# Patient Record
Sex: Male | Born: 1960 | Hispanic: No | Marital: Single | Smoking: Never smoker
Health system: Southern US, Community
[De-identification: ages and names within clinical notes are randomized; demographics above are authoritative.]

## PROBLEM LIST (undated history)

## (undated) DIAGNOSIS — M549 Dorsalgia, unspecified: Secondary | ICD-10-CM

## (undated) HISTORY — PX: HERNIA REPAIR: SHX51

---

## 2003-11-17 ENCOUNTER — Encounter: Admission: RE | Admit: 2003-11-17 | Discharge: 2003-11-17 | Payer: Self-pay | Admitting: General Practice

## 2005-11-08 ENCOUNTER — Emergency Department (HOSPITAL_COMMUNITY): Admission: EM | Admit: 2005-11-08 | Discharge: 2005-11-08 | Payer: Self-pay | Admitting: Emergency Medicine

## 2012-09-07 ENCOUNTER — Encounter (HOSPITAL_COMMUNITY): Payer: Self-pay | Admitting: Emergency Medicine

## 2012-09-07 DIAGNOSIS — R350 Frequency of micturition: Secondary | ICD-10-CM | POA: Insufficient documentation

## 2012-09-07 DIAGNOSIS — M545 Low back pain, unspecified: Secondary | ICD-10-CM | POA: Insufficient documentation

## 2012-09-07 NOTE — ED Notes (Signed)
PT. REPORTS LOW BACK PAIN ONSET THIS EVENING , DENIES INJURY OR FALL ,NO DYSURIA OR HEMATURIA , PAIN WORSE WHEN SITTING.

## 2012-09-08 ENCOUNTER — Emergency Department (HOSPITAL_COMMUNITY)
Admission: EM | Admit: 2012-09-08 | Discharge: 2012-09-08 | Disposition: A | Discharge status: Home / Self Care | Payer: Self-pay | Attending: Emergency Medicine | Admitting: Emergency Medicine

## 2012-09-08 ENCOUNTER — Emergency Department (HOSPITAL_COMMUNITY): Payer: Self-pay

## 2012-09-08 DIAGNOSIS — M545 Low back pain: Secondary | ICD-10-CM

## 2012-09-08 LAB — URINALYSIS, ROUTINE W REFLEX MICROSCOPIC
Bilirubin Urine: NEGATIVE
Nitrite: NEGATIVE
Specific Gravity, Urine: 1.014 (ref 1.005–1.030)
Urobilinogen, UA: 0.2 mg/dL (ref 0.0–1.0)

## 2012-09-08 MED ORDER — HYDROCODONE-ACETAMINOPHEN 5-500 MG PO TABS: 1.0000 | ORAL_TABLET | Freq: Four times a day (QID) | ORAL | Status: DC | PRN

## 2012-09-08 MED ORDER — HYDROCODONE-ACETAMINOPHEN 5-325 MG PO TABS
1.0000 | ORAL_TABLET | Freq: Once | ORAL | Status: AC
  Administered 2012-09-08: 1 via ORAL
  Filled 2012-09-08: qty 1

## 2012-09-08 MED ORDER — IBUPROFEN 600 MG PO TABS: 600.0000 mg | ORAL_TABLET | Freq: Three times a day (TID) | ORAL | Status: DC | PRN

## 2012-09-08 MED ORDER — IBUPROFEN 600 MG PO TABS: 600.0000 mg | ORAL_TABLET | Freq: Four times a day (QID) | ORAL | Status: DC | PRN

## 2012-09-08 MED ORDER — IBUPROFEN 200 MG PO TABS
600.0000 mg | ORAL_TABLET | Freq: Once | ORAL | Status: AC
  Administered 2012-09-08: 600 mg via ORAL
  Filled 2012-09-08: qty 3

## 2012-09-08 NOTE — ED Notes (Signed)
Family at bedside. 

## 2012-09-08 NOTE — ED Notes (Signed)
MD at bedside. 

## 2012-09-08 NOTE — ED Provider Notes (Signed)
History     CSN: [id number]  Arrival date & time 09/07/12  2342   First MD Initiated Contact with Patient 09/08/12 0025      Chief Complaint  Patient presents with  . Back Pain    (Consider location/radiation/quality/duration/timing/severity/associated sxs/prior treatment) Patient is a 51 y.o. male presenting with back pain. The history is provided by the patient.  Back Pain  Pertinent negatives include no chest pain, no fever, no headaches, no abdominal pain and no dysuria.  pt c/o low back pain for past few days. Constant. Dull. Non radiating. Worse w turning and bending. No radicular or leg pain. No abd pain. No fever or chills. No hx ddd or chronic back pain. Denies injury or strain.  Notes urine frequency at night. No hx diabetes, no hx uti or prostatitis. No dysuria. No scrotal or testicular pain. No fever or chills.     History reviewed. No pertinent past medical history.  History reviewed. No pertinent past surgical history.  No family history on file.  History  Substance Use Topics  . Smoking status: Never Smoker   . Smokeless tobacco: Not on file  . Alcohol Use: Yes      Review of Systems  Constitutional: Negative for fever.  HENT: Negative for neck pain.   Eyes: Negative for redness.  Respiratory: Negative for shortness of breath.   Cardiovascular: Negative for chest pain.  Gastrointestinal: Negative for abdominal pain.  Genitourinary: Negative for dysuria and flank pain.  Musculoskeletal: Positive for back pain.  Skin: Negative for rash.  Neurological: Negative for headaches.  Hematological: Does not bruise/bleed easily.  Psychiatric/Behavioral: Negative for confusion.    Allergies  Review of patient's allergies indicates no known allergies.  Home Medications  No current outpatient prescriptions on file.  BP 120/80  Pulse 78  Temp 98.1 F (36.7 C) (Oral)  Resp 14  SpO2 100%  Physical Exam  Nursing note and vitals  reviewed. Constitutional: He is oriented to person, place, and time. He appears well-developed and well-nourished. No distress.  HENT:  Head: Atraumatic.  Eyes: Conjunctivae normal are normal.  Neck: Neck supple. No tracheal deviation present.  Cardiovascular: Normal rate.   Pulmonary/Chest: Effort normal and breath sounds normal. No accessory muscle usage. No respiratory distress.  Abdominal: Soft. Bowel sounds are normal. He exhibits no distension and no mass. There is no tenderness. There is no rebound and no guarding.  Genitourinary:       No cva tenderness  Musculoskeletal: Normal range of motion. He exhibits no edema and no tenderness.       Lumbar tenderness diffusely, otherwise CTLS spine, non tender, aligned, no step off.   Neurological: He is alert and oriented to person, place, and time.       Straight leg raise neg. Steady gait. Motor intact bil.   Skin: Skin is warm and dry. No rash noted.  Psychiatric: He has a normal mood and affect.    ED Course  Procedures (including critical care time)   Labs Reviewed  URINALYSIS, ROUTINE W REFLEX MICROSCOPIC    Results for orders placed during the hospital encounter of 09/08/12  URINALYSIS, ROUTINE W REFLEX MICROSCOPIC      Component Value Range   Color, Urine YELLOW  YELLOW   APPearance CLEAR  CLEAR   Specific Gravity, Urine 1.014  1.005 - 1.030   pH 8.0  5.0 - 8.0   Glucose, UA NEGATIVE  NEGATIVE mg/dL   Hgb urine dipstick NEGATIVE  NEGATIVE  Bilirubin Urine NEGATIVE  NEGATIVE   Ketones, ur NEGATIVE  NEGATIVE mg/dL   Protein, ur NEGATIVE  NEGATIVE mg/dL   Urobilinogen, UA 0.2  0.0 - 1.0 mg/dL   Nitrite NEGATIVE  NEGATIVE   Leukocytes, UA NEGATIVE  NEGATIVE   Dg Lumbar Spine Complete  09/08/2012  *RADIOLOGY REPORT*  Clinical Data: Low back pain  LUMBAR SPINE - COMPLETE 4+ VIEW  Comparison: None.  Findings: There is mild straightening of the normal thoracolumbar lordosis.  Minimal T11 vertebral body height loss and  T11-12 degenerative change.  No displaced fracture.  No dislocation.  No aggressive osseous lesions.  Overlying soft tissues unremarkable.  IMPRESSION: Mild straightening of the normal thoracolumbar lordosis, with mild T11-12 degenerative changes.  No acute osseous abnormality of the lumbar spine.   Original Report Authenticated By: Jearld Lesch, M.D.       MDM  Pt has ride, does not have to drive. vicodin po, motrin po.   Xr.   ua neg. xr w mild deg changes. Pt appears stable for d/c.         Suzi Roots, MD 09/08/12 [phone]

## 2017-07-15 ENCOUNTER — Emergency Department (HOSPITAL_COMMUNITY)
Admission: EM | Admit: 2017-07-15 | Discharge: 2017-07-15 | Disposition: A | Discharge status: Home / Self Care | Payer: Self-pay | Attending: Emergency Medicine | Admitting: Emergency Medicine

## 2017-07-15 ENCOUNTER — Encounter (HOSPITAL_COMMUNITY): Payer: Self-pay | Admitting: Emergency Medicine

## 2017-07-15 DIAGNOSIS — K4090 Unilateral inguinal hernia, without obstruction or gangrene, not specified as recurrent: Secondary | ICD-10-CM | POA: Insufficient documentation

## 2017-07-15 DIAGNOSIS — R1031 Right lower quadrant pain: Secondary | ICD-10-CM

## 2017-07-15 LAB — CBC WITH DIFFERENTIAL/PLATELET
Basophils Absolute: 0 10*3/uL (ref 0.0–0.1)
Basophils Relative: 0 %
EOS PCT: 3 %
Eosinophils Absolute: 0.1 10*3/uL (ref 0.0–0.7)
HEMATOCRIT: 43.1 % (ref 39.0–52.0)
Hemoglobin: 13.9 g/dL (ref 13.0–17.0)
LYMPHS ABS: 2 10*3/uL (ref 0.7–4.0)
LYMPHS PCT: 53 %
MCH: 28.6 pg (ref 26.0–34.0)
MCHC: 32.3 g/dL (ref 30.0–36.0)
MCV: 88.7 fL (ref 78.0–100.0)
MONO ABS: 0.2 10*3/uL (ref 0.1–1.0)
MONOS PCT: 6 %
NEUTROS PCT: 38 %
Neutro Abs: 1.4 10*3/uL — ABNORMAL LOW (ref 1.7–7.7)
Platelets: 186 10*3/uL (ref 150–400)
RBC: 4.86 MIL/uL (ref 4.22–5.81)
RDW: 14.2 % (ref 11.5–15.5)
WBC: 3.8 10*3/uL — AB (ref 4.0–10.5)

## 2017-07-15 LAB — COMPREHENSIVE METABOLIC PANEL
ALT: 68 U/L — ABNORMAL HIGH (ref 17–63)
AST: 69 U/L — AB (ref 15–41)
Albumin: 3.9 g/dL (ref 3.5–5.0)
Alkaline Phosphatase: 68 U/L (ref 38–126)
Anion gap: 7 (ref 5–15)
BILIRUBIN TOTAL: 0.7 mg/dL (ref 0.3–1.2)
BUN: 11 mg/dL (ref 6–20)
CO2: 28 mmol/L (ref 22–32)
Calcium: 9 mg/dL (ref 8.9–10.3)
Chloride: 105 mmol/L (ref 101–111)
Creatinine, Ser: 1.18 mg/dL (ref 0.61–1.24)
Glucose, Bld: 104 mg/dL — ABNORMAL HIGH (ref 65–99)
POTASSIUM: 4.4 mmol/L (ref 3.5–5.1)
Sodium: 140 mmol/L (ref 135–145)
TOTAL PROTEIN: 6.6 g/dL (ref 6.5–8.1)

## 2017-07-15 LAB — URINALYSIS, ROUTINE W REFLEX MICROSCOPIC
BILIRUBIN URINE: NEGATIVE
Glucose, UA: NEGATIVE mg/dL
HGB URINE DIPSTICK: NEGATIVE
Ketones, ur: NEGATIVE mg/dL
Leukocytes, UA: NEGATIVE
NITRITE: NEGATIVE
PROTEIN: NEGATIVE mg/dL
Specific Gravity, Urine: 1.016 (ref 1.005–1.030)
pH: 7 (ref 5.0–8.0)

## 2017-07-15 MED ORDER — IBUPROFEN 600 MG PO TABS: 600.0000 mg | ORAL_TABLET | Freq: Four times a day (QID) | ORAL | 0 refills | Status: DC | PRN

## 2017-07-15 MED ORDER — KETOROLAC TROMETHAMINE 30 MG/ML IJ SOLN
30.0000 mg | Freq: Once | INTRAMUSCULAR | Status: AC
  Administered 2017-07-15: 30 mg via INTRAVENOUS
  Filled 2017-07-15: qty 1

## 2017-07-15 MED ORDER — TRAMADOL HCL 50 MG PO TABS: 50.0000 mg | ORAL_TABLET | Freq: Four times a day (QID) | ORAL | 0 refills | Status: AC | PRN

## 2017-07-15 NOTE — ED Provider Notes (Signed)
[facility]   CSN: [id number] Arrival date & time: 07/15/17  [id number]     History   Chief Complaint Chief Complaint  Patient presents with  . Groin Pain    Swelling    HPI  Christopher Olsen is a 56 y.o. Male who reports pain in the right upper groin for 3 weeks. Patient reports that when he stands up a lump appears in the right groin. He also notices the lump more when he coughs. It always goes away when he lays down. Patient reports some intermittent tenderness and discomfort over the lump. No medications at home to treat pain. He denies associated fevers or chills, no abdominal pain, pain in the testicles or scrotum, no dysuria or penile discharge, no nausea vomiting.        History reviewed. No pertinent past medical history.  There are no active problems to display for this patient.   History reviewed. No pertinent surgical history.     Home Medications    Prior to Admission medications   Medication Sig Start Date End Date Taking? Authorizing Provider  HYDROcodone-acetaminophen (VICODIN) 5-500 MG per tablet Take 1-2 tablets by mouth every 6 (six) hours as needed for pain. 09/08/12   Cathren LaineSteinl, Kevin, MD  HYDROcodone-acetaminophen (VICODIN) 5-500 MG per tablet Take 1-2 tablets by mouth every 6 (six) hours as needed for pain. 09/08/12   Cathren LaineSteinl, Kevin, MD  ibuprofen (ADVIL,MOTRIN) 600 MG tablet Take 1 tablet (600 mg total) by mouth every 8 (eight) hours as needed for pain. Take with food. 09/08/12   Cathren LaineSteinl, Kevin, MD    Family History No family history on file.  Social History Social History  Substance Use Topics  . Smoking status: Never Smoker  . Smokeless tobacco: Never Used  . Alcohol use Yes     Allergies   Patient has no known allergies.   Review of Systems Review of Systems  Constitutional: Negative for chills and fever.  Respiratory: Negative for cough and shortness of breath.   Cardiovascular:  Negative for chest pain.  Gastrointestinal: Negative for abdominal pain, constipation, diarrhea, nausea and vomiting.  Genitourinary: Negative for discharge, dysuria, penile pain, penile swelling, scrotal swelling and testicular pain.       Groin pain  Musculoskeletal: Negative for arthralgias and myalgias.  Skin: Negative for color change and rash.  All other systems reviewed and are negative.    Physical Exam Updated Vital Signs BP 116/80 (BP Location: Right Arm)   Pulse 63   Temp 98.2 F (36.8 C)   Resp 20   Ht 5\' 9"  (1.753 m)   Wt 83.9 kg (185 lb)   SpO2 100%   BMI 27.32 kg/m   Physical Exam  Constitutional: He appears well-developed and well-nourished. No distress.  HENT:  Head: Normocephalic and atraumatic.  Eyes: Right eye exhibits no discharge. Left eye exhibits no discharge.  Cardiovascular: Normal rate, regular rhythm and normal heart sounds.   Pulmonary/Chest: Effort normal and breath sounds normal. No respiratory distress. He has no wheezes. He has no rales.  Abdominal: Soft. Bowel sounds are normal. He exhibits no distension. There is no tenderness. There is no guarding. A hernia is present. Hernia confirmed positive in the right inguinal area.  Abdomen NTTP in all quadrants, no guarding Right inguinal hernia palpated when pt is standing, reduces when pt lays down, easily slides with valsalva, minimal tenderness  Genitourinary: Testes normal and penis normal.  Neurological: He is alert. Coordination normal.  Skin: Skin is warm and dry. He is not diaphoretic.  Psychiatric: He has a normal mood and affect. His behavior is normal.  Nursing note and vitals reviewed.    ED Treatments / Results  Labs (all labs ordered are listed, but only abnormal results are displayed) Labs Reviewed  CBC WITH DIFFERENTIAL/PLATELET - Abnormal; Notable for the following:       Result Value   WBC 3.8 (*)    Neutro Abs 1.4 (*)    All other components within normal limits    COMPREHENSIVE METABOLIC PANEL - Abnormal; Notable for the following:    Glucose, Bld 104 (*)    AST 69 (*)    ALT 68 (*)    All other components within normal limits  URINALYSIS, ROUTINE W REFLEX MICROSCOPIC    EKG  EKG Interpretation None       Radiology No results found.  Procedures Procedures (including critical care time)  Medications Ordered in ED Medications  ketorolac (TORADOL) 30 MG/ML injection 30 mg (30 mg Intravenous Given 07/15/17 1158)     Initial Impression / Assessment and Plan / ED Course  I have reviewed the triage vital signs and the nursing notes.  Pertinent labs & imaging results that were available during my care of the patient were reviewed by me and considered in my medical decision making (see chart for details).  Patient presents with right groin pain.  Presentation consistent with inguinal hernia, which reduces easily, no concern for incarceration, minimal tenderness on exam and patient reports discomfort in the area over the past few weeks so patient will be referred to follow-up with general surgery outpatient for repair.  Pain treated in the ED with improvement.  Patient stable for discharge home with ibuprofen for pain, short prescription of tramadol for breakthrough pain.  Return precautions discussed.  Patient expresses understanding and agrees with plan.  Patient discussed with Dr. Donnald Garre, who saw patient as well and agrees with plan.  Final Clinical Impressions(s) / ED Diagnoses   Final diagnoses:  Inguinal hernia, right  Right inguinal pain    New Prescriptions Discharge Medication List as of 07/15/2017 11:47 AM    START taking these medications   Details  !! ibuprofen (ADVIL,MOTRIN) 600 MG tablet Take 1 tablet (600 mg total) by mouth every 6 (six) hours as needed., Starting Tue 07/15/2017, Print    traMADol (ULTRAM) 50 MG tablet Take 1 tablet (50 mg total) by mouth every 6 (six) hours as needed., Starting Tue 07/15/2017, Print      !! - Potential duplicate medications found. Please discuss with provider.       Dartha Lodge, PA-C 07/15/17 2352    Arby Barrette, MD 07/19/17 [phone]

## 2017-07-15 NOTE — ED Notes (Signed)
Pt stable,ambulatory, and verbalizes understanding of D/C instructions.   

## 2017-07-15 NOTE — Discharge Instructions (Signed)
You may take ibuprofen for pain, Tramadol for breakthrough pain. Please call [facility] to schedule appointment for follow-up.  Get help right away if: The area where the legs meets the lower belly has: Pain that gets worse suddenly. A bulge that gets bigger suddenly and does not go down. A bulge that turns red or purple. A bulge that is painful to the touch. You are a man and your scrotum: Suddenly feels painful. Suddenly changes in size. You feel sick to your stomach (nauseous) and this feeling does not go away. You throw up (vomit) and this keeps happening. You feel your heart beating a lot more quickly than normal. You cannot poop (have a bowel movement) or pass gas.

## 2017-07-15 NOTE — ED Triage Notes (Addendum)
Patient reports right upper groin pain radiating to lower back with lump/swelling onset 3 weeks ago  , denies injury , no dysuria or fever .

## 2017-07-15 NOTE — ED Provider Notes (Signed)
Medical screening examination/treatment/procedure(s) were conducted as a shared visit with non-physician practitioner(s) and myself.  I personally evaluated the patient during the encounter.   EKG Interpretation None     Patient has had a lump that comes up in his right groin with standing.  Is been occurring for several weeks now.  It has become increasingly uncomfortable.  It goes down when he lies down.  On physical examination patient is alert and nontoxic in no distress.  Lower abdomen is nontender.  In supine position no mass or fullness in the right inguinal canal.  With Valsalva and palpation patient does have sliding hernia.  This reduces easily.  I agree with plan and management.   Arby BarrettePfeiffer, Kollen Armenti, MD 07/15/17 1212

## 2017-07-15 NOTE — ED Notes (Signed)
Got patient undress on the monitor patient is resting with call bell in reach 

## 2017-07-24 ENCOUNTER — Encounter (HOSPITAL_COMMUNITY): Payer: Self-pay | Admitting: Emergency Medicine

## 2017-07-24 DIAGNOSIS — Z79899 Other long term (current) drug therapy: Secondary | ICD-10-CM | POA: Insufficient documentation

## 2017-07-24 DIAGNOSIS — K4090 Unilateral inguinal hernia, without obstruction or gangrene, not specified as recurrent: Secondary | ICD-10-CM | POA: Insufficient documentation

## 2017-07-24 MED ORDER — IBUPROFEN 400 MG PO TABS
400.0000 mg | ORAL_TABLET | Freq: Once | ORAL | Status: AC | PRN
  Administered 2017-07-24: 400 mg via ORAL

## 2017-07-24 MED ORDER — IBUPROFEN 400 MG PO TABS
ORAL_TABLET | ORAL | Status: AC
  Filled 2017-07-24: qty 1

## 2017-07-24 NOTE — ED Triage Notes (Signed)
Pt c/o 9/10 right groin pain was seen on the ED a week ago for same c/o and pt was told that he has a hernia, pt states pain is not getting better.

## 2017-07-25 ENCOUNTER — Emergency Department (HOSPITAL_COMMUNITY)
Admission: EM | Admit: 2017-07-25 | Discharge: 2017-07-25 | Disposition: A | Discharge status: Home / Self Care | Payer: Self-pay | Attending: Emergency Medicine | Admitting: Emergency Medicine

## 2017-07-25 ENCOUNTER — Emergency Department (HOSPITAL_COMMUNITY): Payer: Self-pay

## 2017-07-25 DIAGNOSIS — K4090 Unilateral inguinal hernia, without obstruction or gangrene, not specified as recurrent: Secondary | ICD-10-CM

## 2017-07-25 LAB — CBC WITH DIFFERENTIAL/PLATELET
BASOS ABS: 0 10*3/uL (ref 0.0–0.1)
BASOS PCT: 0 %
Eosinophils Absolute: 0.1 10*3/uL (ref 0.0–0.7)
Eosinophils Relative: 2 %
HEMATOCRIT: 40.9 % (ref 39.0–52.0)
HEMOGLOBIN: 13.4 g/dL (ref 13.0–17.0)
Lymphocytes Relative: 49 %
Lymphs Abs: 2.2 10*3/uL (ref 0.7–4.0)
MCH: 29.3 pg (ref 26.0–34.0)
MCHC: 32.8 g/dL (ref 30.0–36.0)
MCV: 89.3 fL (ref 78.0–100.0)
MONOS PCT: 11 %
Monocytes Absolute: 0.5 10*3/uL (ref 0.1–1.0)
Neutro Abs: 1.8 10*3/uL (ref 1.7–7.7)
Neutrophils Relative %: 38 %
Platelets: 193 10*3/uL (ref 150–400)
RBC: 4.58 MIL/uL (ref 4.22–5.81)
RDW: 14.1 % (ref 11.5–15.5)
WBC: 4.6 10*3/uL (ref 4.0–10.5)

## 2017-07-25 LAB — BASIC METABOLIC PANEL
ANION GAP: 6 (ref 5–15)
BUN: 16 mg/dL (ref 6–20)
CHLORIDE: 103 mmol/L (ref 101–111)
CO2: 30 mmol/L (ref 22–32)
Calcium: 9.2 mg/dL (ref 8.9–10.3)
Creatinine, Ser: 1.4 mg/dL — ABNORMAL HIGH (ref 0.61–1.24)
GFR calc non Af Amer: 55 mL/min — ABNORMAL LOW (ref >60)
GLUCOSE: 118 mg/dL — AB (ref 65–99)
Potassium: 4.5 mmol/L (ref 3.5–5.1)
Sodium: 139 mmol/L (ref 135–145)

## 2017-07-25 MED ORDER — HYDROCODONE-ACETAMINOPHEN 5-325 MG PO TABS: 1.0000 | ORAL_TABLET | Freq: Four times a day (QID) | ORAL | 0 refills | Status: AC | PRN

## 2017-07-25 MED ORDER — IOPAMIDOL (ISOVUE-300) INJECTION 61%
INTRAVENOUS | Status: AC
  Administered 2017-07-25: 100 mL
  Filled 2017-07-25: qty 100

## 2017-07-25 MED ORDER — MORPHINE SULFATE (PF) 4 MG/ML IV SOLN
4.0000 mg | Freq: Once | INTRAVENOUS | Status: AC
Start: 2017-07-25 — End: 2017-07-25
  Administered 2017-07-25: 4 mg via INTRAVENOUS
  Filled 2017-07-25: qty 1

## 2017-07-25 MED ORDER — POLYETHYLENE GLYCOL 3350 17 GM/SCOOP PO POWD: 17.0000 g | Freq: Two times a day (BID) | ORAL | 0 refills | Status: AC

## 2017-07-25 MED ORDER — ONDANSETRON HCL 4 MG/2ML IJ SOLN
4.0000 mg | Freq: Once | INTRAMUSCULAR | Status: AC
  Administered 2017-07-25: 4 mg via INTRAVENOUS
  Filled 2017-07-25: qty 2

## 2017-07-25 NOTE — ED Provider Notes (Signed)
[facility] Provider [facility]   CSN: [id number] Arrival date & time: 07/24/17  2339     History   Chief Complaint Chief Complaint  Patient presents with  . Groin Pain    HPI Christopher Olsen is a 56 y.o. male.  Patient presents to the ED with a chief complaint of right groin pain.  He reports a history of inguinal hernia.  States that he is scheduled to see surgery, but his pain acutely worsened.  He states that he was told to come to the ER if that happened.  He rates his pain as moderate to severe.  It is worsened with palpation and coughing.  He denies any nausea or vomiting.  He denies any other associated symptoms.  He reports smaller stools, but is still having BMs.   The history is provided by the patient. No language interpreter was used.    History reviewed. No pertinent past medical history.  There are no active problems to display for this patient.   History reviewed. No pertinent surgical history.     Home Medications    Prior to Admission medications   Medication Sig Start Date End Date Taking? Authorizing Provider  HYDROcodone-acetaminophen (VICODIN) 5-500 MG per tablet Take 1-2 tablets by mouth every 6 (six) hours as needed for pain. Patient not taking: Reported on 07/15/2017 09/08/12   Cathren LaineSteinl, Kevin, MD  HYDROcodone-acetaminophen (VICODIN) 5-500 MG per tablet Take 1-2 tablets by mouth every 6 (six) hours as needed for pain. Patient not taking: Reported on 07/15/2017 09/08/12   Cathren LaineSteinl, Kevin, MD  ibuprofen (ADVIL,MOTRIN) 600 MG tablet Take 1 tablet (600 mg total) by mouth every 8 (eight) hours as needed for pain. Take with food. Patient not taking: Reported on 07/15/2017 09/08/12   Cathren LaineSteinl, Kevin, MD  ibuprofen (ADVIL,MOTRIN) 600 MG tablet Take 1 tablet (600 mg total) by mouth every 6 (six) hours as needed. 07/15/17   Dartha LodgeFord, Kelsey N, PA-C  naproxen sodium (ANAPROX) 220 MG tablet Take 220 mg by mouth 2 (two) times daily as  needed (pain).    [provider]  traMADol (ULTRAM) 50 MG tablet Take 1 tablet (50 mg total) by mouth every 6 (six) hours as needed. 07/15/17   Dartha LodgeFord, Kelsey N, PA-C    Family History No family history on file.  Social History Social History  Substance Use Topics  . Smoking status: Never Smoker  . Smokeless tobacco: Never Used  . Alcohol use Yes     Allergies   Patient has no known allergies.   Review of Systems Review of Systems  All other systems reviewed and are negative.    Physical Exam Updated Vital Signs BP 115/82 (BP Location: Right Arm)   Pulse 68 Comment: Simultaneous filing. User may not have seen previous data.  Temp 98.1 F (36.7 C) (Oral)   Resp 18   SpO2 97% Comment: Simultaneous filing. User may not have seen previous data.  Physical Exam  Constitutional: He is oriented to person, place, and time. He appears well-developed and well-nourished.  HENT:  Head: Normocephalic and atraumatic.  Eyes: Pupils are equal, round, and reactive to light. Conjunctivae and EOM are normal. Right eye exhibits no discharge. Left eye exhibits no discharge. No scleral icterus.  Neck: Normal range of motion. Neck supple. No JVD present.  Cardiovascular: Normal rate, regular rhythm and normal heart sounds.  Exam reveals no gallop and no friction rub.   No murmur heard. Pulmonary/Chest: Effort normal and breath  sounds normal. No respiratory distress. He has no wheezes. He has no rales. He exhibits no tenderness.  Abdominal: Soft. He exhibits no distension and no mass. There is tenderness. There is no rebound and no guarding.  TTP in right inguinal canal  Musculoskeletal: Normal range of motion. He exhibits no edema or tenderness.  Neurological: He is alert and oriented to person, place, and time.  Skin: Skin is warm and dry.  Psychiatric: He has a normal mood and affect. His behavior is normal. Judgment and thought content normal.  Nursing note and vitals  reviewed.    ED Treatments / Results  Labs (all labs ordered are listed, but only abnormal results are displayed) Labs Reviewed  BASIC METABOLIC PANEL - Abnormal; Notable for the following:       Result Value   Glucose, Bld 118 (*)    Creatinine, Ser 1.40 (*)    GFR calc non Af Amer 55 (*)    All other components within normal limits  CBC WITH DIFFERENTIAL/PLATELET    EKG  EKG Interpretation None       Radiology Ct Abdomen Pelvis W Contrast  Result Date: 07/25/2017 CLINICAL DATA:  Abdominal pain.  Right groin pain. EXAM: CT ABDOMEN AND PELVIS WITH CONTRAST TECHNIQUE: Multidetector CT imaging of the abdomen and pelvis was performed using the standard protocol following bolus administration of intravenous contrast. CONTRAST:  ISOVUE-300 IOPAMIDOL (ISOVUE-300) INJECTION 61% COMPARISON:  None. FINDINGS: Lower chest: The lung bases are clear. Hepatobiliary: Focal fatty infiltration about the falciform ligament. No focal lesion. Gallbladder physiologically distended, no calcified stone. No biliary dilatation. Pancreas: No ductal dilatation or inflammation. Spleen: Normal in size without focal abnormality. Adrenals/Urinary Tract: No adrenal nodule. No hydronephrosis or perinephric edema. Homogeneous renal enhancement with symmetric excretion on delayed phase imaging. Urinary bladder is physiologically distended without wall thickening. Stomach/Bowel: Stomach is within normal limits. Appendix appears normal. No evidence of bowel wall thickening, distention, or inflammatory changes. Sigmoid colonic tortuosity. Moderate colonic stool burden. Mild fecalization of distal small bowel contents. Vascular/Lymphatic: Normal caliber abdominal aorta. No acute vascular abnormalities. No abdominal or pelvic adenopathy. Reproductive: Prostate is unremarkable. Other: Fat containing right inguinal hernia. No inflammatory changes herniated fat. No bowel involvement. No free air or free fluid.  Musculoskeletal: There are no acute or suspicious osseous abnormalities. IMPRESSION: 1. Small fat containing right inguinal hernia without inflammation or bowel involvement. 2. Colonic tortuosity with moderate colonic stool burden and fecalization of small bowel contents suggesting slow transit. Electronically Signed   By: Rubye Oaks M.D.   On: 07/25/2017 06:09    Procedures Procedures (including critical care time)  Medications Ordered in ED Medications  ibuprofen (ADVIL,MOTRIN) 400 MG tablet (not administered)  morphine 4 MG/ML injection 4 mg (not administered)  ondansetron (ZOFRAN) injection 4 mg (not administered)  ibuprofen (ADVIL,MOTRIN) tablet 400 mg (400 mg Oral Given 07/24/17 2354)     Initial Impression / Assessment and Plan / ED Course  I have reviewed the triage vital signs and the nursing notes.  Pertinent labs & imaging results that were available during my care of the patient were reviewed by me and considered in my medical decision making (see chart for details).     Patient with lower abdominal pain. He reports hx of inguinal hernia.  He reports worsening pain.  Was told to come to the ER if pain worsened.  CT shows small fat containing right inguinal hernia without inflammation or bowel involvement.  Patient may be having some pain  due to constipation.  Will prescribe miralax.    Recommend surgery follow-up.  Patient understands and agrees with the plan.  Final Clinical Impressions(s) / ED Diagnoses   Final diagnoses:  Unilateral inguinal hernia without obstruction or gangrene, recurrence not specified    New Prescriptions New Prescriptions   HYDROCODONE-ACETAMINOPHEN (NORCO/VICODIN) 5-325 MG TABLET    Take 1-2 tablets by mouth every 6 (six) hours as needed.   POLYETHYLENE GLYCOL POWDER (GLYCOLAX/MIRALAX) POWDER    Take 17 g by mouth 2 (two) times daily. Until daily soft stools  OTC     Roxy Horseman, PA-C 07/25/17 1610    Ward, Layla Maw,  DO 07/25/17 [phone]

## 2017-07-25 NOTE — ED Notes (Signed)
Patient transported to CT 

## 2020-01-27 ENCOUNTER — Emergency Department (HOSPITAL_COMMUNITY): Payer: Self-pay

## 2020-01-27 ENCOUNTER — Encounter (HOSPITAL_COMMUNITY): Payer: Self-pay | Admitting: Emergency Medicine

## 2020-01-27 ENCOUNTER — Other Ambulatory Visit: Payer: Self-pay

## 2020-01-27 ENCOUNTER — Emergency Department (HOSPITAL_COMMUNITY)
Admission: EM | Admit: 2020-01-27 | Discharge: 2020-01-27 | Disposition: A | Discharge status: Home / Self Care | Payer: Self-pay | Attending: Emergency Medicine | Admitting: Emergency Medicine

## 2020-01-27 DIAGNOSIS — Z79899 Other long term (current) drug therapy: Secondary | ICD-10-CM | POA: Insufficient documentation

## 2020-01-27 DIAGNOSIS — W19XXXA Unspecified fall, initial encounter: Secondary | ICD-10-CM | POA: Insufficient documentation

## 2020-01-27 DIAGNOSIS — M545 Low back pain, unspecified: Secondary | ICD-10-CM

## 2020-01-27 DIAGNOSIS — Y999 Unspecified external cause status: Secondary | ICD-10-CM | POA: Insufficient documentation

## 2020-01-27 DIAGNOSIS — Y9289 Other specified places as the place of occurrence of the external cause: Secondary | ICD-10-CM | POA: Insufficient documentation

## 2020-01-27 DIAGNOSIS — Y9389 Activity, other specified: Secondary | ICD-10-CM | POA: Insufficient documentation

## 2020-01-27 DIAGNOSIS — S53402A Unspecified sprain of left elbow, initial encounter: Secondary | ICD-10-CM

## 2020-01-27 DIAGNOSIS — S53492A Other sprain of left elbow, initial encounter: Secondary | ICD-10-CM | POA: Insufficient documentation

## 2020-01-27 HISTORY — DX: Dorsalgia, unspecified: M54.9

## 2020-01-27 MED ORDER — ACETAMINOPHEN 325 MG PO TABS
650.0000 mg | ORAL_TABLET | Freq: Once | ORAL | Status: AC
  Administered 2020-01-27: 12:00:00 650 mg via ORAL
  Filled 2020-01-27: qty 2

## 2020-01-27 MED ORDER — LIDOCAINE 4 % EX PTCH: MEDICATED_PATCH | CUTANEOUS | 0 refills | Status: AC

## 2020-01-27 NOTE — ED Provider Notes (Signed)
[facility] EMERGENCY DEPARTMENT Provider Note   CSN: [id number] Arrival date & time: 01/27/20  1037     History Chief Complaint  Patient presents with  . Fall    Christopher Olsen is a 59 y.o. male.  HPI      Christopher Olsen is a 59 y.o. male who presents to the Emergency Department complaining of left elbow and lower back pain secondary to a mechanical fall that occurred several days ago.  He complains of continued pain with movement of the left elbow.  He describes the pain as aching.  He also complains of pain across his right lower back that is worse with movement and improves at rest.  He denies head injury or LOC.  He has not tried any medications for symptomatic therapy prior to arrival.  He denies any neck pain, numbness or weakness of his extremities, chest or abdominal pain, urine or bowel changes, fever or chills.    Past Medical History:  Diagnosis Date  . Back pain     There are no problems to display for this patient.   Past Surgical History:  Procedure Laterality Date  . HERNIA REPAIR       No family history on file.  Social History   Tobacco Use  . Smoking status: Never Smoker  . Smokeless tobacco: Never Used  Substance Use Topics  . Alcohol use: Yes    Alcohol/week: 2.0 standard drinks    Types: 2 Cans of beer per week    Comment: occ  . Drug use: No    Home Medications Prior to Admission medications   Medication Sig Start Date End Date Taking? Authorizing Provider  HYDROcodone-acetaminophen (NORCO/VICODIN) 5-325 MG tablet Take 1-2 tablets by mouth every 6 (six) hours as needed. 07/25/17   Roxy Horseman, PA-C  naproxen sodium (ANAPROX) 220 MG tablet Take 220 mg by mouth 2 (two) times daily as needed (pain).    [provider]  polyethylene glycol powder (GLYCOLAX/MIRALAX) powder Take 17 g by mouth 2 (two) times daily. Until daily soft stools  OTC 07/25/17   Roxy Horseman, PA-C  traMADol (ULTRAM) 50 MG tablet Take 1 tablet (50 mg  total) by mouth every 6 (six) hours as needed. 07/15/17   Dartha Lodge, PA-C    Allergies    Patient has no known allergies.  Review of Systems   Review of Systems  Constitutional: Negative for chills and fever.  Respiratory: Negative for chest tightness and shortness of breath.   Cardiovascular: Negative for chest pain.  Gastrointestinal: Negative for abdominal pain, nausea and vomiting.  Genitourinary: Negative for dysuria and frequency.  Musculoskeletal: Positive for arthralgias (Left elbow pain) and back pain. Negative for joint swelling and neck pain.  Skin: Negative for color change and wound.  Neurological: Negative for dizziness, syncope, weakness, numbness and headaches.    Physical Exam Updated Vital Signs BP (!) 144/94 (BP Location: Right Arm)   Pulse 80   Temp 98.2 F (36.8 C) (Oral)   Resp 16   Ht 5\' 8"  (1.727 m)   Wt 81.6 kg   SpO2 100%   BMI 27.37 kg/m   Physical Exam Vitals and nursing note reviewed.  Constitutional:      Appearance: Normal appearance. He is not ill-appearing or toxic-appearing.  HENT:     Head: Atraumatic.  Cardiovascular:     Rate and Rhythm: Normal rate and regular rhythm.     Pulses: Normal pulses.  Pulmonary:  Effort: Pulmonary effort is normal.     Breath sounds: Normal breath sounds.  Chest:     Chest wall: No tenderness.  Abdominal:     General: There is no distension.     Palpations: Abdomen is soft.  Musculoskeletal:        General: Tenderness and signs of injury present. No swelling.     Left elbow: No swelling or effusion. Normal range of motion. Tenderness present.     Cervical back: Normal range of motion. No rigidity or tenderness.     Comments: Diffuse tenderness to the lateral left elbow.  No bony deformity or edema.  No skin changes.  Patient has full range of motion of the joint.  Left wrist and shoulder are nontender.  Tenderness to palpation of the lower spine and right lumbar paraspinal muscles.  No bony  deformities.  Negative straight leg raise bilaterally.  Skin:    General: Skin is warm.     Findings: No erythema or rash.  Neurological:     General: No focal deficit present.     Mental Status: He is alert.     Sensory: Sensation is intact. No sensory deficit.     Motor: Motor function is intact. No weakness.     Coordination: Coordination is intact.     Comments: CN II-XII grossly intact.  Speech clear     ED Results / Procedures / Treatments   Labs (all labs ordered are listed, but only abnormal results are displayed) Labs Reviewed - No data to display  EKG None  Radiology DG Lumbar Spine Complete  Result Date: 01/27/2020 CLINICAL DATA:  Fall.  Back pain. EXAM: LUMBAR SPINE - COMPLETE 4+ VIEW COMPARISON:  09/08/2012 FINDINGS: No fracture, bone lesion or spondylolisthesis. Disc spaces are well maintained. There are minimal mid to lower lumbar spine endplate osteophytes. Facet joints are well preserved. Soft tissues are unremarkable. IMPRESSION: 1. No fracture or acute finding.  No significant abnormality. Electronically Signed   By: Amie Portland M.D.   On: 01/27/2020 12:15   DG Elbow Complete Left  Result Date: 01/27/2020 CLINICAL DATA:  Larey Seat several days ago.  Left elbow pain. EXAM: LEFT ELBOW - COMPLETE 3+ VIEW COMPARISON:  None. FINDINGS: There is no evidence of fracture, dislocation, or joint effusion. There is no evidence of arthropathy or other focal bone abnormality. Soft tissues are unremarkable. IMPRESSION: Negative. Electronically Signed   By: Amie Portland M.D.   On: 01/27/2020 12:16    Procedures Procedures (including critical care time)  Medications Ordered in ED Medications  acetaminophen (TYLENOL) tablet 650 mg (650 mg Oral Given 01/27/20 1135)    ED Course  I have reviewed the triage vital signs and the nursing notes.  Pertinent labs & imaging results that were available during my care of the patient were reviewed by me and considered in my medical decision  making (see chart for details).    MDM Rules/Calculators/A&P                      Patient here for evaluation following a mechanical fall that occurred several days ago.  He is ambulatory with a steady gait.  No focal neuro deficits on exam.  X-rays are reassuring for acute bony process.  Injuries are likely musculoskeletal.  He agrees to symptomatic treatment.  He is also requested prostate screening, but denies any urinary symptoms or abdominal pain.  No fever. States he does not have a primary care provider.  I have explained that we do not provide routine prostate screenings out of the emergency room as this is not an emergent issue.  I will provide resource information so that he may establish primary care for this. He agrees to plan.     Final Clinical Impression(s) / ED Diagnoses Final diagnoses:  Sprain of left elbow, initial encounter  Acute right-sided low back pain without sciatica  Fall, initial encounter    Rx / DC Orders ED Discharge Orders    None       Kem Parkinson, PA-C 01/28/20 2341    Maudie Flakes, MD 02/03/20 0030

## 2020-01-27 NOTE — Discharge Instructions (Addendum)
You may alternate ice and heat to your lower back and ice to your left elbow.  Your x-rays today are negative for broken bones or dislocations.  You may take Tylenol every 4 hours if needed for pain and apply the prescribed patches to the affected areas as directed.  I have listed a clinic to contact for you to establish primary care

## 2020-01-27 NOTE — ED Triage Notes (Signed)
Larey Seat several days ago onto a rock outside.  Denies dizziness, slip and fall.  C/o pain to left elbow and left lower back.

## 2020-06-17 IMAGING — DX DG LUMBAR SPINE COMPLETE 4+V
5 series · 5 of 5 positions shown · non-contrast
Comparison: 09/08/2012

CLINICAL DATA: Fall.  Back pain.

EXAM:
LUMBAR SPINE - COMPLETE 4+ VIEW

[l-spine ap]
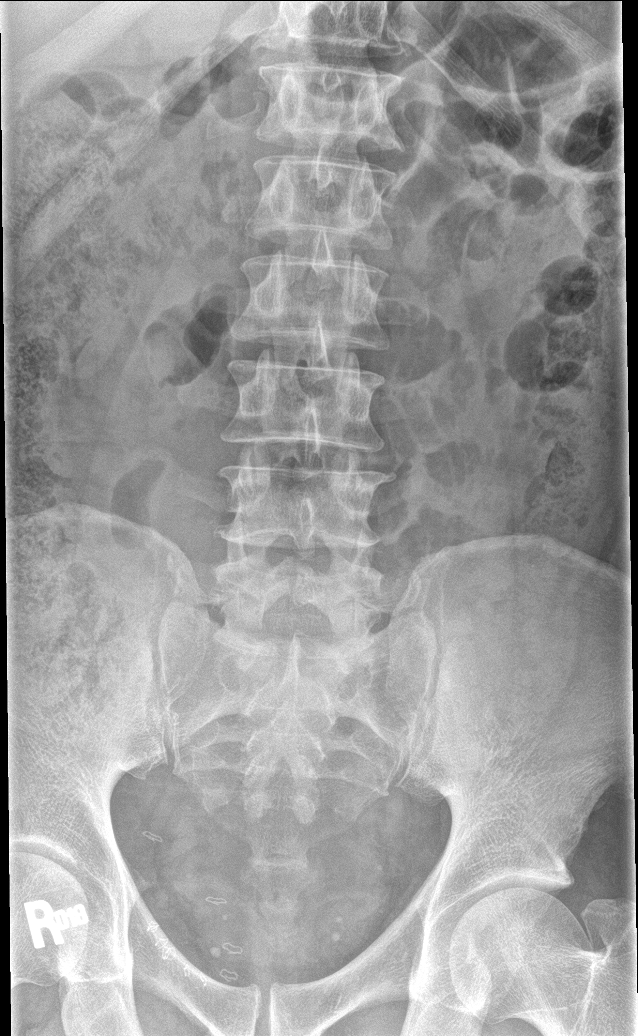

[l-spine obl (1 of 2)]
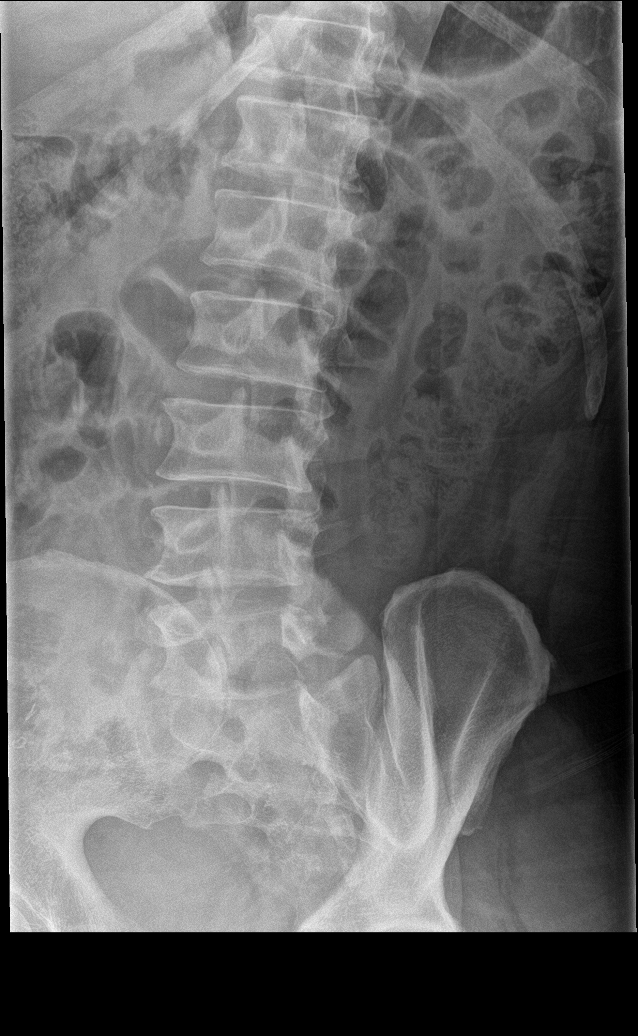

[l-spine obl (2 of 2)]
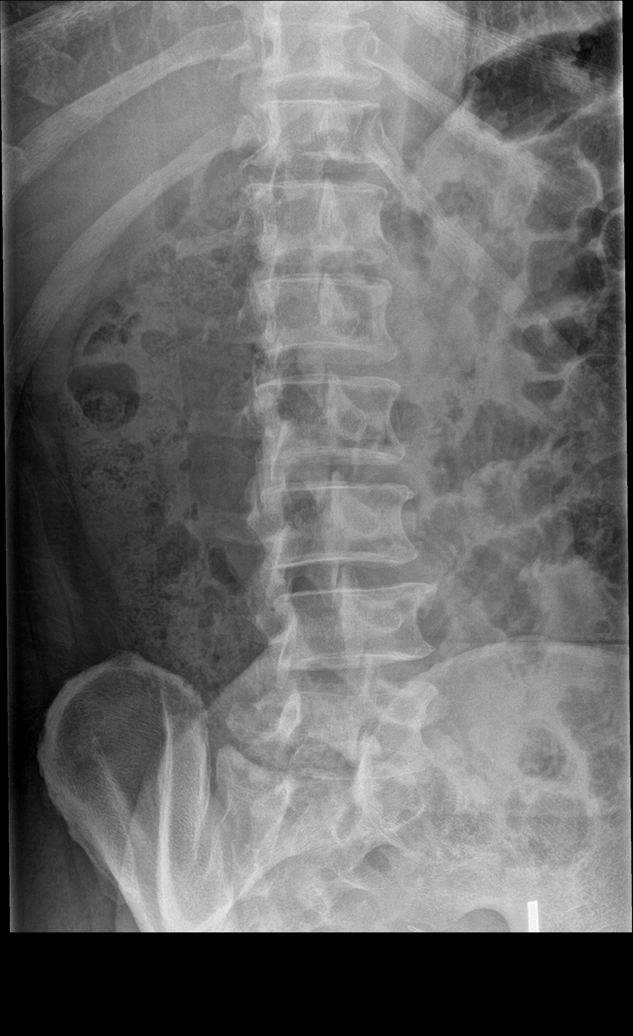

[l-spine lat]
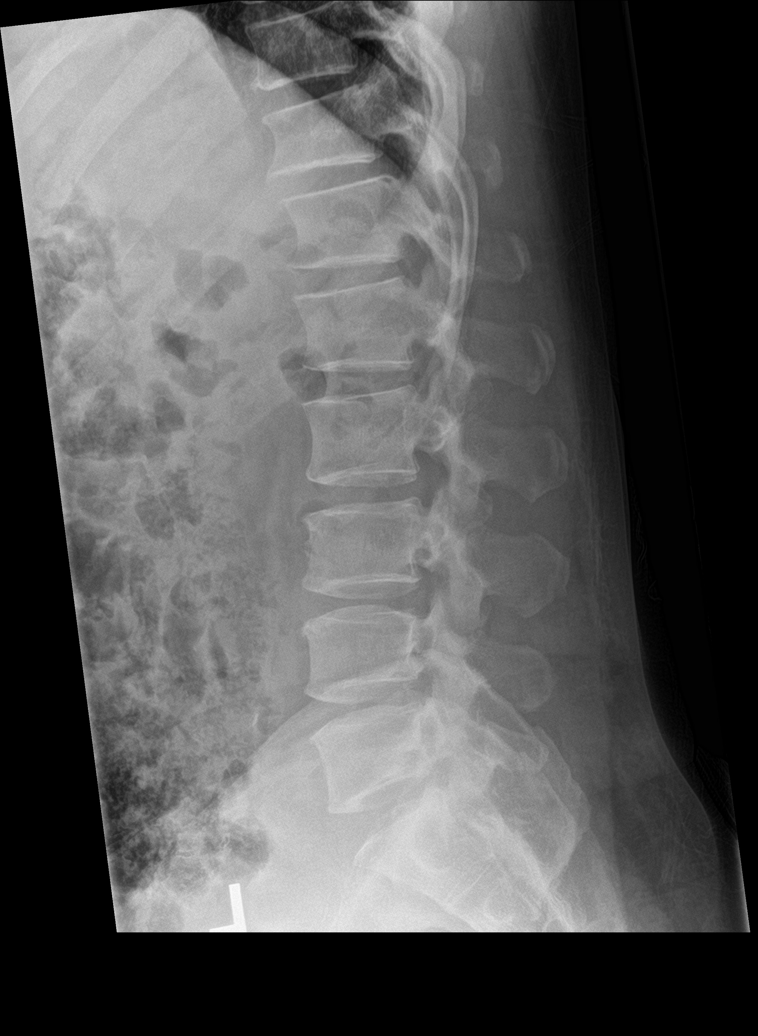

[l-spine spot]
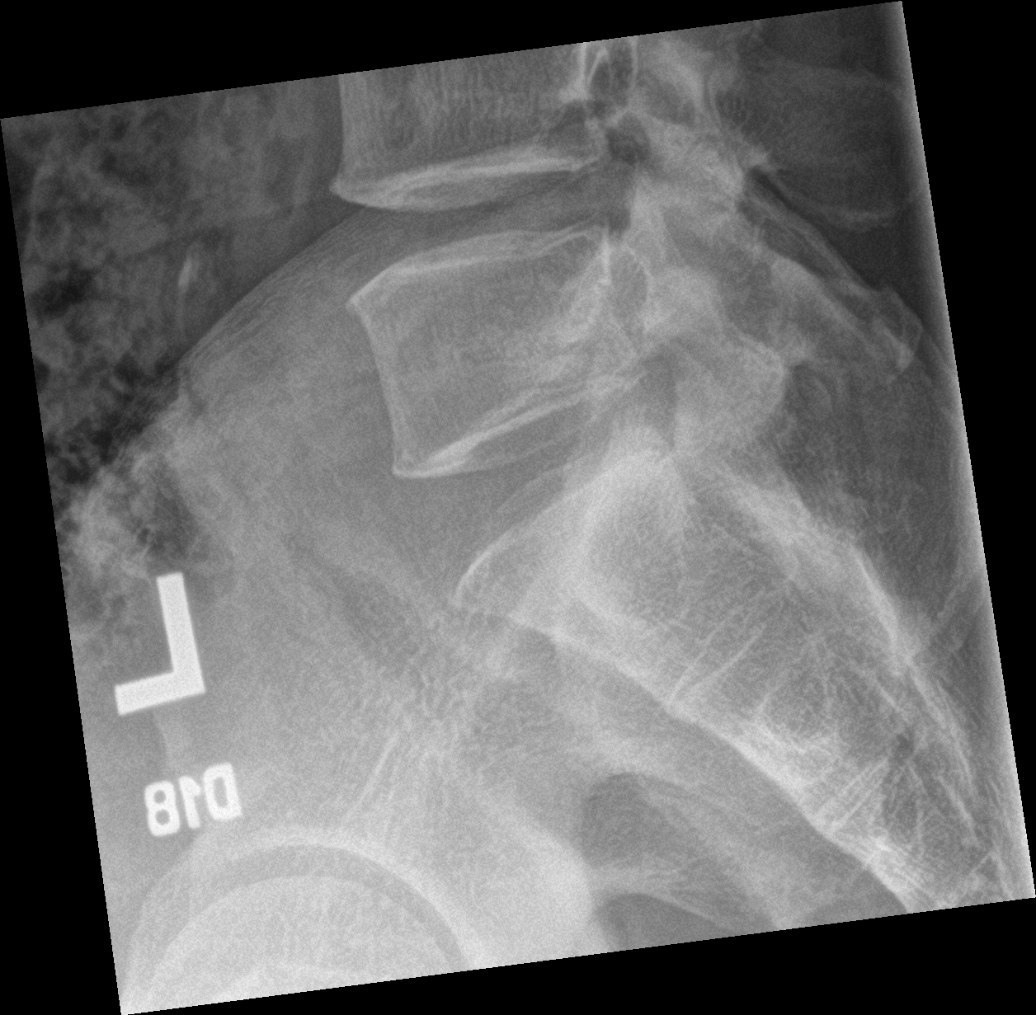

[5 of 5 positions shown; findings below may reference images not displayed]

FINDINGS: No fracture, bone lesion or spondylolisthesis.

Disc spaces are well maintained. There are minimal mid to lower
lumbar spine endplate osteophytes. Facet joints are well preserved.

Soft tissues are unremarkable.
IMPRESSION: 1. No fracture or acute finding.  No significant abnormality.
